# Patient Record
Sex: Male | Born: 1955 | Race: White | Hispanic: No | Marital: Married | Smoking: Former smoker
Health system: Southern US, Community
[De-identification: ages and names within clinical notes are randomized; demographics above are authoritative.]

## PROBLEM LIST (undated history)

## (undated) DIAGNOSIS — B019 Varicella without complication: Secondary | ICD-10-CM

## (undated) DIAGNOSIS — J45909 Unspecified asthma, uncomplicated: Secondary | ICD-10-CM

## (undated) DIAGNOSIS — J302 Other seasonal allergic rhinitis: Secondary | ICD-10-CM

## (undated) HISTORY — DX: Unspecified asthma, uncomplicated: J45.909

## (undated) HISTORY — DX: Other seasonal allergic rhinitis: J30.2

## (undated) HISTORY — DX: Varicella without complication: B01.9

---

## 2001-01-16 ENCOUNTER — Encounter: Payer: Self-pay | Admitting: Emergency Medicine

## 2001-01-16 ENCOUNTER — Emergency Department (HOSPITAL_COMMUNITY): Admission: EM | Admit: 2001-01-16 | Discharge: 2001-01-16 | Payer: Self-pay | Admitting: Emergency Medicine

## 2006-11-14 ENCOUNTER — Emergency Department (HOSPITAL_COMMUNITY): Admission: EM | Admit: 2006-11-14 | Discharge: 2006-11-14 | Payer: Self-pay | Admitting: Emergency Medicine

## 2008-07-25 IMAGING — CR DG FOOT COMPLETE 3+V*L*
3 series · 3 of 3 positions shown · non-contrast
Comparison: none

CLINICAL DATA: Fell four days ago on the ice.  Mid and lateral foot pain into toes.  
 LEFT FOOT ? 3 VIEW:

[t foot ap left]
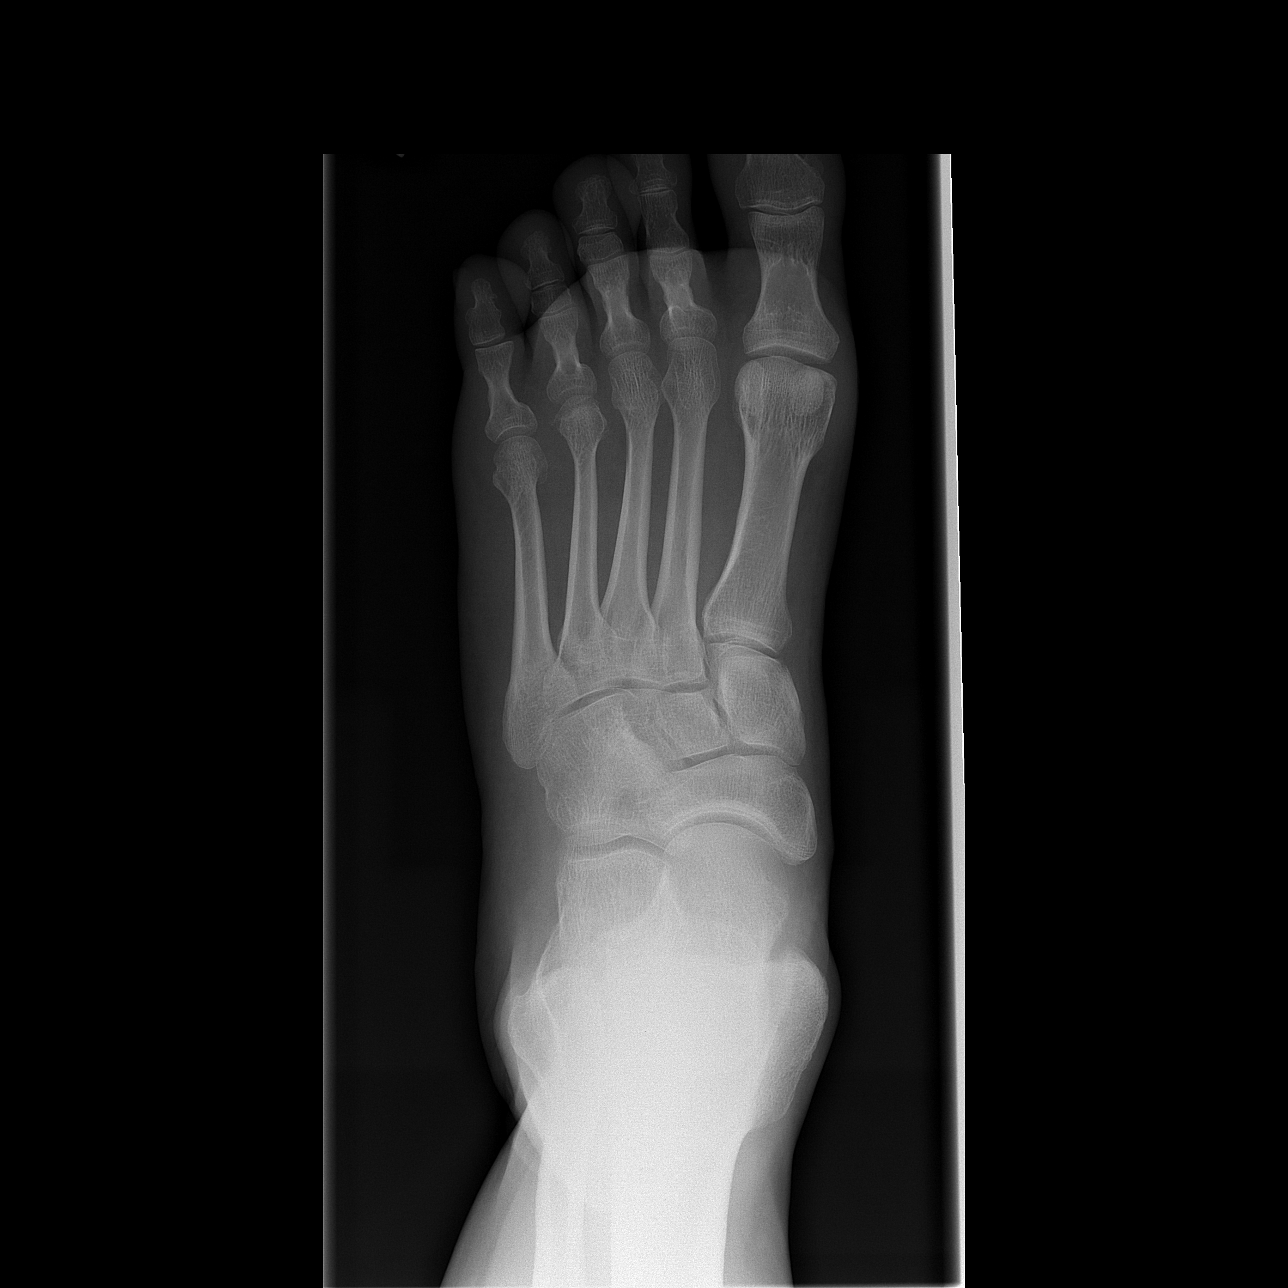

[t foot oblique left]
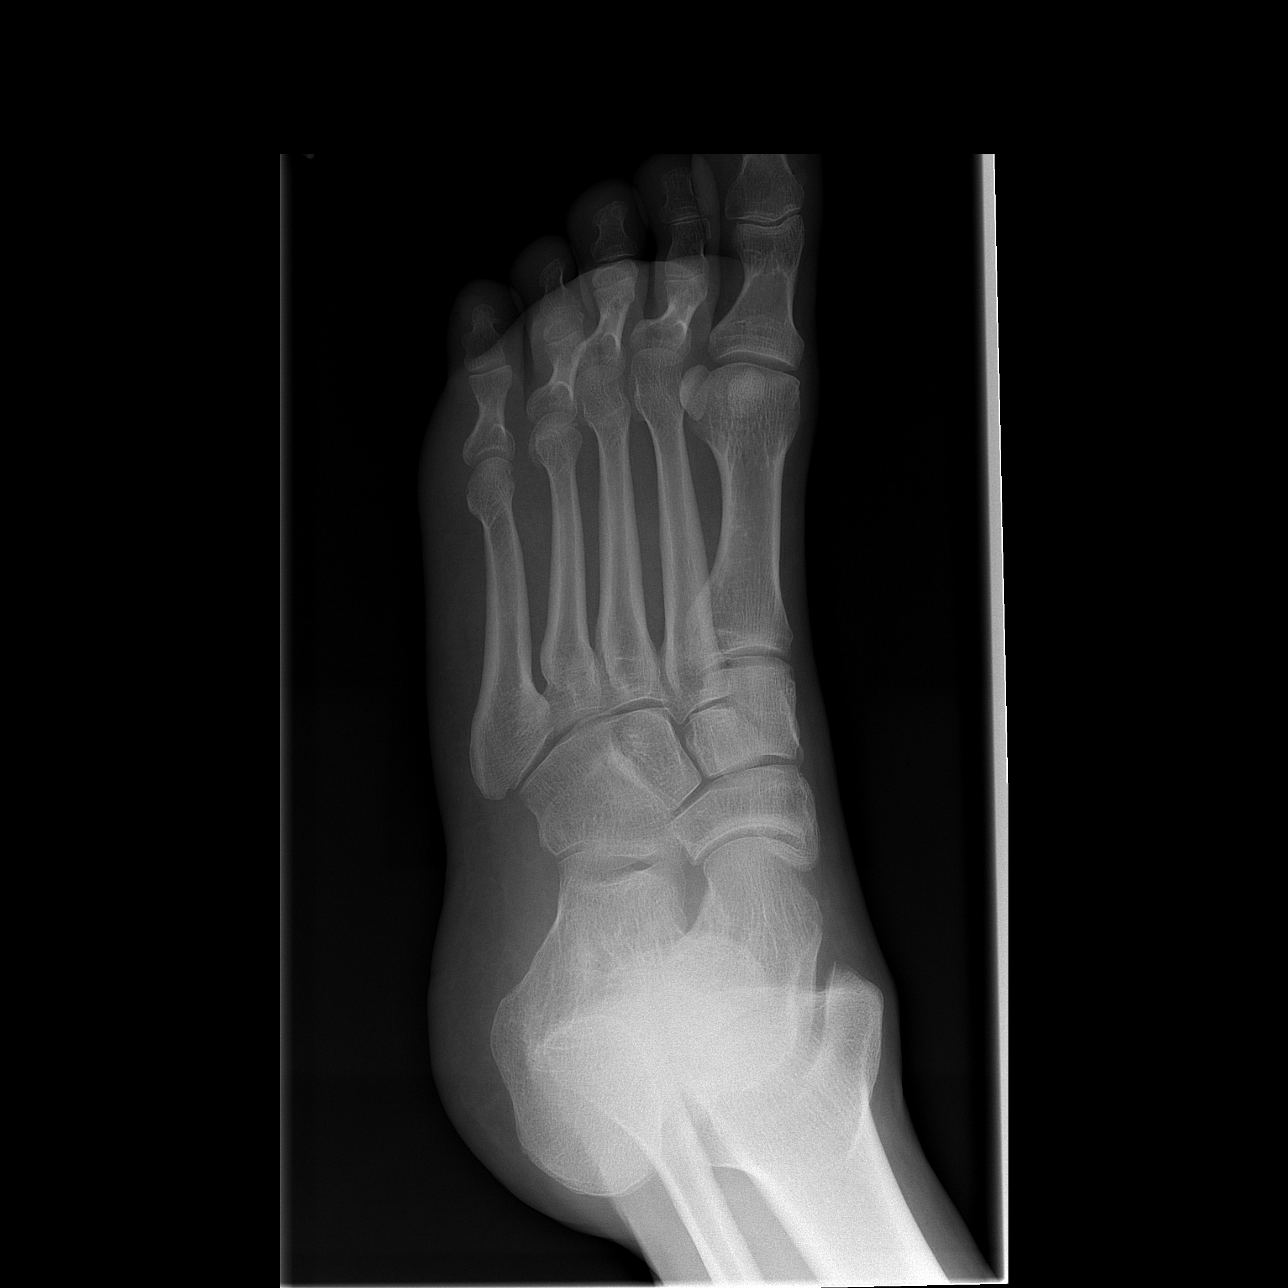

[t foot lat left]
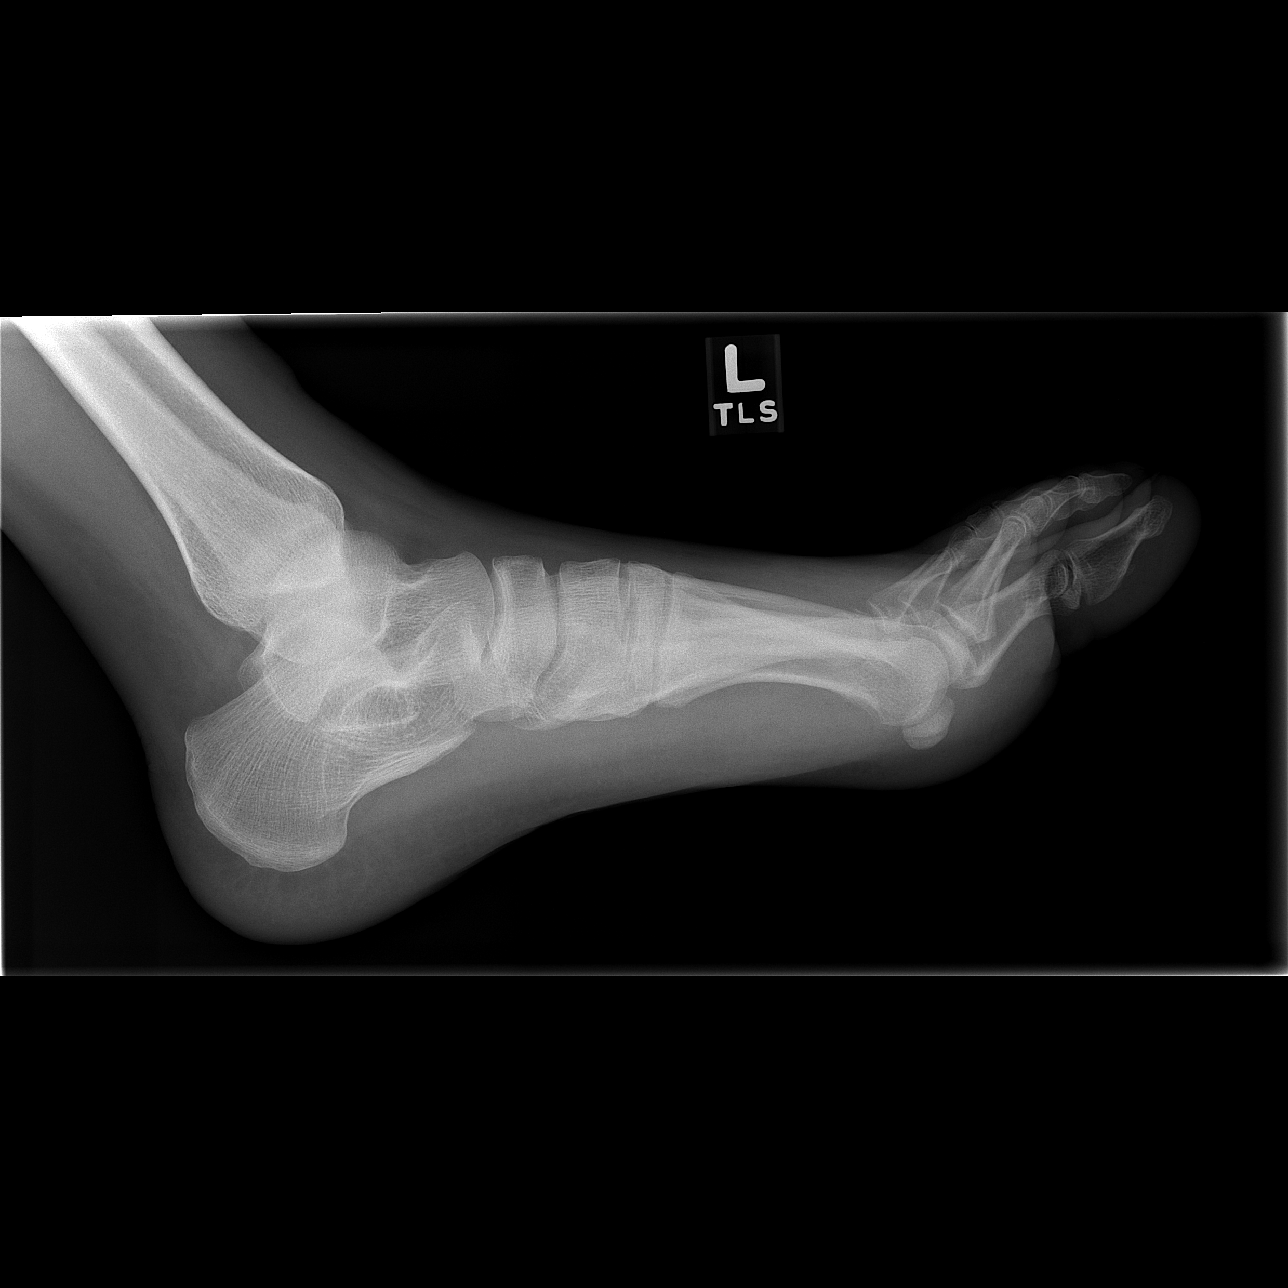

[3 of 3 positions shown; findings below may reference images not displayed]

FINDINGS: Forefoot soft tissue swelling.   No acute bony abnormality.
IMPRESSION: No fracture.

## 2017-10-25 HISTORY — PX: VARICOSE VEIN SURGERY: SHX832

## 2017-12-12 ENCOUNTER — Encounter: Payer: Self-pay | Admitting: Family Medicine

## 2017-12-12 ENCOUNTER — Ambulatory Visit: Payer: BLUE CROSS/BLUE SHIELD | Admitting: Family Medicine

## 2017-12-12 VITALS — BP 130/84 | HR 78 | Temp 98.3°F | Ht 71.0 in | Wt 204.6 lb

## 2017-12-12 DIAGNOSIS — J449 Chronic obstructive pulmonary disease, unspecified: Secondary | ICD-10-CM

## 2017-12-12 DIAGNOSIS — J309 Allergic rhinitis, unspecified: Secondary | ICD-10-CM | POA: Insufficient documentation

## 2017-12-12 DIAGNOSIS — R21 Rash and other nonspecific skin eruption: Secondary | ICD-10-CM | POA: Insufficient documentation

## 2017-12-12 DIAGNOSIS — L739 Follicular disorder, unspecified: Secondary | ICD-10-CM | POA: Diagnosis not present

## 2017-12-12 MED ORDER — KETOCONAZOLE 2 % EX CREA: 1.0000 "application " | TOPICAL_CREAM | Freq: Two times a day (BID) | CUTANEOUS | 1 refills | Status: DC

## 2017-12-12 NOTE — Assessment & Plan Note (Signed)
Possibly consistent with chronic fungal infection.  We will start ketoconazole cream twice daily for the next week.  Psoriasis is also a consideration, however less likely given location of lesion and lack of lesions elsewhere on the body. If no improvement on topical antifungal, would consider trial of mid to high potency topical steroid. May ultimately need biopsy if no improvement with topical therapies.

## 2017-12-12 NOTE — Patient Instructions (Signed)
Please start the ketoconazole.  Use twice daily for a week.   Let me know if not improving.  Come back to see me soon for your annual physical.  Take care, Dr Jimmey RalphParker

## 2017-12-12 NOTE — Progress Notes (Signed)
Subjective:  Justin Hudson is a 62 y.o. male who presents today with a chief complaint of rash and to establish care.   HPI:  Rash, new issue Patient with 2 separate rashes: 1.  Rash on left lower extremity.  This is been present for several years.  Has tried several over-the-counter medications including hydrogen peroxide and cortisone cream.  Neither of these significantly seem to help.  Rash is very itchy.  No obvious precipitating events.  Stable over the last several months.  No fevers.  Occasionally has yellow/bloody drainage.  No spreading erythema.  No pain.  2.  Rash on upper back.  This was been present for the past several weeks.  Relates this to recently being diagnosed with the flu.  No recent new exposures to soaps, detergents, lotions, or any other new contacts.  No fevers or chills.  ROS: Per HPI, otherwise complete review of systems is negative.  PMH:  The following were reviewed and entered/updated in epic: History reviewed. No pertinent past medical history. Patient Active Problem List   Diagnosis Date Noted  . OAD (obstructive airway disease) (HCC) 12/12/2017  . Allergic rhinitis 12/12/2017  . Skin rash 12/12/2017   History reviewed. No pertinent surgical history.  FH of cardiac disease. Two uncle died of MI.   Medications- reviewed and updated Current Outpatient Medications  Medication Sig Dispense Refill  . albuterol (PROAIR HFA) 108 (90 Base) MCG/ACT inhaler Inhale into the lungs.    [name] loratadine (CLARITIN) 10 MG tablet Take by mouth.    [name] ketoconazole (NIZORAL) 2 % cream Apply 1 application topically 2 (two) times daily. 60 g 1   No current facility-administered medications for this visit.    Allergies-reviewed and updated Allergies  Allergen Reactions  . Shellfish Allergy Anaphylaxis   Social History   Socioeconomic History  . Marital status: Married    Spouse name: None  . Number of children: None  . Years of education: None  . Highest  education level: None  Social Needs  . Financial resource strain: None  . Food insecurity - worry: None  . Food insecurity - inability: None  . Transportation needs - medical: None  . Transportation needs - non-medical: None  Occupational History  . None  Tobacco Use  . Smoking status: Former Smoker    Types: Cigarettes  . Smokeless tobacco: Never Used  Substance and Sexual Activity  . Alcohol use: Yes    Alcohol/week: 12.0 oz    Types: 20 Cans of beer per week  . Drug use: None  . Sexual activity: None  Other Topics Concern  . None  Social History Narrative  . None   Objective:  Physical Exam: BP 130/84   Pulse 78   Temp 98.3 F (36.8 C) (Oral)   Ht 5\' 11"  (1.803 m)   Wt 204 lb 9.6 oz (92.8 kg)   SpO2 96%   BMI 28.54 kg/m   Gen: NAD, resting comfortably CV: RRR with no murmurs appreciated Pulm: NWOB, CTAB with no crackles, wheezes, or rhonchi GI: Normal bowel sounds present. Soft, Nontender, Nondistended. MSK: No edema, cyanosis, or clubbing noted Skin: -Upper back: Several small, discrete, erythematous pustules in various stages of healing scattered across upper back. -Left lower extremity: Erythematous plaque with overlying scale and granulation tissue approximately 10 x 5 cm.  Please see below picture. Neuro: Grossly normal, moves all extremities Psych: Normal affect and thought content    Assessment/Plan:  Skin rash Possibly consistent with  chronic fungal infection.  We will start ketoconazole cream twice daily for the next week.  Psoriasis is also a consideration, however less likely given location of lesion and lack of lesions elsewhere on the body. If no improvement on topical antifungal, would consider trial of mid to high potency topical steroid. May ultimately need biopsy if no improvement with topical therapies.   Folliculitis Reassured patient.  Encouraged use of body wash with benzoyl peroxide.  Return precautions reviewed.  Preventative  healthcare Patient will return soon for CPE.  Katina Degreealeb M. Jimmey RalphParker, MD 12/12/2017 11:32 AM

## 2017-12-13 ENCOUNTER — Ambulatory Visit: Payer: Self-pay | Admitting: Physician Assistant

## 2019-09-11 ENCOUNTER — Other Ambulatory Visit: Payer: Self-pay

## 2019-09-11 DIAGNOSIS — Z20822 Contact with and (suspected) exposure to covid-19: Secondary | ICD-10-CM

## 2019-09-12 LAB — NOVEL CORONAVIRUS, NAA: SARS-CoV-2, NAA: DETECTED — AB

## 2020-01-03 ENCOUNTER — Ambulatory Visit: Payer: BLUE CROSS/BLUE SHIELD | Admitting: Cardiology

## 2020-01-31 NOTE — Progress Notes (Signed)
Cardiology Office Note   Date:  02/01/2020   ID:  Jeanie Sewer, DOB December 27, 1955, MRN [id number]  PCP:  Jani Gravel, MD  Cardiologist:   No primary care provider on file. Referring:  Jani Gravel, MD  Chief Complaint  Patient presents with  . Chest Pain      History of Present Illness: Justin Hudson is a 64 y.o. male who is referred by Jani Gravel, MD for evaluation of chest pain.  He has no prior cardiac history although he does have risk factors.  He did report a distant treadmill testing and echocardiography when he was 52.  He has been having some fleeting chest discomfort.  This is mild and across his mid and left upper chest.  He had comes on and lasts for short period of time.  It happens sporadically.  It is dull and feels like a muscle ache.  He cannot bring it on.  He can do some yard work and other activities without bringing this on.  It goes away spontaneously.  Is not worse with movement or position.  He does not think it is getting worse.  He has not had associated symptoms such as nausea vomiting or diaphoresis.  He never really had these symptoms before.  However, with his family history he was referred for further evaluation.   Past Medical History:  Diagnosis Date  . Asthma   . Chicken pox   . Seasonal allergies     Past Surgical History:  Procedure Laterality Date  . VARICOSE VEIN SURGERY  2019     Current Outpatient Medications  Medication Sig Dispense Refill  . albuterol (PROAIR HFA) 108 (90 Base) MCG/ACT inhaler Inhale into the lungs.    [name] loratadine (CLARITIN) 10 MG tablet Take by mouth.     No current facility-administered medications for this visit.    Allergies:   Shellfish allergy    Social History:  The patient  reports that he has quit smoking. His smoking use included cigarettes. He has never used smokeless tobacco. He reports current alcohol use of about 20.0 standard drinks of alcohol per week.   Family History:  The patient's family  history includes Alcohol abuse in his brother, maternal grandfather, and sister; Cancer in his father; Drug abuse in his brother and sister; Heart disease in his maternal grandmother and mother; Heart disease (age of onset: 13) in his father; Peripheral vascular disease (age of onset: 73) in his brother.    ROS:  Please see the history of present illness.   Otherwise, review of systems are positive for none.   All other systems are reviewed and negative.    PHYSICAL EXAM: VS:  BP (!) 150/101   Pulse 69   Temp 97.9 F (36.6 C)   Ht 6' (1.829 m)   Wt 206 lb 0.3 oz (93.5 kg)   SpO2 99%   BMI 27.94 kg/m  , BMI Body mass index is 27.94 kg/m. GENERAL:  Well appearing HEENT:  Pupils equal round and reactive, fundi not visualized, oral mucosa unremarkable NECK:  No jugular venous distention, waveform within normal limits, carotid upstroke brisk and symmetric, no bruits, no thyromegaly LYMPHATICS:  No cervical, inguinal adenopathy LUNGS:  Clear to auscultation bilaterally BACK:  No CVA tenderness CHEST:  Unremarkable HEART:  PMI not displaced or sustained,S1 and S2 within normal limits, no S3, no S4, no clicks, no rubs, no murmurs ABD:  Flat, positive bowel sounds normal in frequency in pitch, no  bruits, no rebound, no guarding, no midline pulsatile mass, no hepatomegaly, no splenomegaly EXT:  2 plus pulses throughout, no edema, no cyanosis no clubbing SKIN:  No rashes no nodules NEURO:  Cranial nerves II through XII grossly intact, motor grossly intact throughout PSYCH:  Cognitively intact, oriented to person place and time    EKG:  EKG is ordered today. The ekg ordered today demonstrates sinus rhythm, rate 69, axis within normal limits, intervals within normal limits, no acute ST-T wave changes.   Recent Labs: No results found for requested labs within last 8760 hours.    Lipid Panel No results found for: CHOL, TRIG, HDL, CHOLHDL, VLDL, LDLCALC, LDLDIRECT    Wt Readings from  Last 3 Encounters:  02/01/20 206 lb 0.3 oz (93.5 kg)  12/12/17 204 lb 9.6 oz (92.8 kg)      Other studies Reviewed: Additional studies/ records that were reviewed today include: Labs. Review of the above records demonstrates:  Please see elsewhere in the note.     ASSESSMENT AND PLAN:  CHEST PAIN: This is atypical but he has had significant risk factors.  Given this I think it is very reasonable to screen him with a coronary calcium score.  This will determine the need for further testing and also goals of therapy for his lipids which are pending.  HTN: We had a long discussion about this.  He is never been told he was hypertensive and his last blood pressure was fine and a blood pressure in our system was fine.  He is going to keep a record of his blood pressures have suggested a blood pressure diary.  However, I do not think he needs med titration given the fact that this is an isolated elevated reading.  COVID EDUCATION: He has no interest in the Covid vaccine.  He says he has had Covid.   Current medicines are reviewed at length with the patient today.  The patient does not have concerns regarding medicines.  The following changes have been made:  no change  Labs/ tests ordered today include:   Orders Placed This Encounter  Procedures  . CT CARDIAC SCORING  . EKG 12-Lead     Disposition:   FU with me as needed based on the results of the above.     Signed, Rollene Rotunda, MD  02/01/2020 12:34 PM    [facility]

## 2020-02-01 ENCOUNTER — Encounter: Payer: Self-pay | Admitting: Cardiology

## 2020-02-01 ENCOUNTER — Other Ambulatory Visit: Payer: Self-pay

## 2020-02-01 ENCOUNTER — Ambulatory Visit (INDEPENDENT_AMBULATORY_CARE_PROVIDER_SITE_OTHER): Payer: Self-pay | Admitting: Cardiology

## 2020-02-01 DIAGNOSIS — R072 Precordial pain: Secondary | ICD-10-CM

## 2020-02-01 NOTE — Patient Instructions (Signed)
Medication Instructions:  No changes *If you need a refill on your cardiac medications before your next appointment, please call your pharmacy*  Lab Work: None ordered this visit  Testing/Procedures: Dr. Percival Spanish has ordered a CT coronary calcium score. This test is done at [address]. This is $150 out of pocket. Coronary CalciumScan A coronary calcium scan is an imaging test used to look for deposits of calcium and other fatty materials (plaques) in the inner lining of the blood vessels of the heart (coronary arteries). These deposits of calcium and plaques can partly clog and narrow the coronary arteries without producing any symptoms or warning signs. This puts a person at risk for a heart attack. This test can detect these deposits before symptoms develop. Tell a health care provider about:  Any allergies you have.  All medicines you are taking, including vitamins, herbs, eye drops, creams, and over-the-counter medicines.  Any problems you or family members have had with anesthetic medicines.  Any blood disorders you have.  Any surgeries you have had.  Any medical conditions you have.  Whether you are pregnant or may be pregnant. What are the risks? Generally, this is a safe procedure. However, problems may occur, including:  Harm to a pregnant woman and her unborn baby. This test involves the use of radiation. Radiation exposure can be dangerous to a pregnant woman and her unborn baby. If you are pregnant, you generally should not have this procedure done.  Slight increase in the risk of cancer. This is because of the radiation involved in the test. What happens before the procedure? No preparation is needed for this procedure. What happens during the procedure?  You will undress and remove any jewelry around your neck or chest.  You will put on a hospital gown.  Sticky electrodes will be placed on your chest. The electrodes will be connected to an  electrocardiogram (ECG) machine to record a tracing of the electrical activity of your heart.  A CT scanner will take pictures of your heart. During this time, you will be asked to lie still and hold your breath for 2-3 seconds while a picture of your heart is being taken. The procedure may vary among health care providers and hospitals. What happens after the procedure?  You can get dressed.  You can return to your normal activities.  It is up to you to get the results of your test. Ask your health care provider, or the department that is doing the test, when your results will be ready. Summary  A coronary calcium scan is an imaging test used to look for deposits of calcium and other fatty materials (plaques) in the inner lining of the blood vessels of the heart (coronary arteries).  Generally, this is a safe procedure. Tell your health care provider if you are pregnant or may be pregnant.  No preparation is needed for this procedure.  A CT scanner will take pictures of your heart.  You can return to your normal activities after the scan is done. This information is not intended to replace advice given to you by your health care provider. Make sure you discuss any questions you have with your health care provider. Document Released: 04/08/2008 Document Revised: 08/30/2016 Document Reviewed: 08/30/2016 Elsevier Interactive Patient Education  2017 [place]: At [facility], you and your health needs are our priority.  As part of our continuing mission to provide you with exceptional heart care, we have created designated  Provider Care Teams.  These Care Teams include your primary Cardiologist (physician) and Advanced Practice Providers (APPs -  Physician Assistants and Nurse Practitioners) who all work together to provide you with the care you need, when you need it.  Your next appointment:   Follow up as needed

## 2020-02-08 ENCOUNTER — Telehealth: Payer: Self-pay | Admitting: Cardiology

## 2020-02-08 NOTE — Telephone Encounter (Signed)
New message:  Patient would like to cancel his up coming appt at [facility] for a CT. Please call patient.

## 2020-02-08 NOTE — Telephone Encounter (Signed)
Contacted patient- he states it was already taken care of, he just needed to move the dates.

## 2020-02-13 ENCOUNTER — Other Ambulatory Visit: Payer: Self-pay

## 2020-02-15 ENCOUNTER — Ambulatory Visit (INDEPENDENT_AMBULATORY_CARE_PROVIDER_SITE_OTHER)
Admission: RE | Admit: 2020-02-15 | Discharge: 2020-02-15 | Disposition: A | Discharge status: Home / Self Care | Payer: Self-pay | Source: Ambulatory Visit | Attending: Cardiology | Admitting: Cardiology

## 2020-02-15 ENCOUNTER — Other Ambulatory Visit: Payer: Self-pay

## 2020-02-15 DIAGNOSIS — R072 Precordial pain: Secondary | ICD-10-CM

## 2020-02-18 ENCOUNTER — Other Ambulatory Visit: Payer: Self-pay

## 2020-02-18 ENCOUNTER — Telehealth: Payer: Self-pay | Admitting: Cardiology

## 2020-02-18 DIAGNOSIS — R931 Abnormal findings on diagnostic imaging of heart and coronary circulation: Secondary | ICD-10-CM

## 2020-02-18 NOTE — Telephone Encounter (Signed)
Attempted to call patient, voicemail left 

## 2020-02-18 NOTE — Progress Notes (Signed)
Per Dr. Antoine Poche order placed for POET due to elevated coronary calcium.

## 2020-02-18 NOTE — Telephone Encounter (Signed)
   Pt is returning call from [facility] regarding CT result  Please call

## 2020-02-19 NOTE — Telephone Encounter (Signed)
Left message for patient to call regarding ETT and COVID prescreening ordered by Dr. Antoine Poche

## 2020-02-25 NOTE — Telephone Encounter (Signed)
Spoke with patient regarding appointment for ETT and COVID screening ordered by Dr. Michael Boston states he cannot do anything until after next Thursday--

## 2020-03-10 ENCOUNTER — Encounter (HOSPITAL_COMMUNITY): Payer: Self-pay | Admitting: Cardiology

## 2020-03-26 ENCOUNTER — Telehealth (HOSPITAL_COMMUNITY): Payer: Self-pay | Admitting: Cardiology

## 2020-03-26 NOTE — Telephone Encounter (Signed)
Just an FYI. We have made several attempts to contact this patient including sending a letter to schedule or reschedule their GXT. We will be removing the patient from the echo WQ.   Mailed letter  03/10/20 [facility] to schedule @ 1:20/LBW  03/06/20 [facility] to schedule @ 9:54/LBW  02/19/20 [facility] to schedule @ 9:59/LBW  wt 206     Thank you

## 2020-03-26 NOTE — Telephone Encounter (Signed)
Will forward to Dr Hochrein so he will be aware  

## 2021-10-26 IMAGING — CT CT CARDIAC CORONARY ARTERY CALCIUM SCORE
3 series · 14 of 20 positions shown, 15 images · non-contrast
Comparison: None.
COMPARISON: None.

Addendum:
EXAM:
OVER-READ INTERPRETATION  CT CHEST

The following report is an over-read performed by radiologist Dr.
Isadora Diouf [[email or web address]] on 02/15/2020. This
over-read does not include interpretation of cardiac or coronary
anatomy or pathology. The coronary calcium score interpretation by
the cardiologist is attached.
CLINICAL DATA: Risk stratification
Coronary Calcium Score
TECHNIQUE: The patient was scanned on a Siemens Force scanner. Axial
non-contrast 3 mm slices were carried out through the heart. The
data set was analyzed on a dedicated work station and scored using
the Agatson method.

[Series 2: casc 3.0 bv41 2 bestsyst 34 % · axial · 0.42mm/px · z∈[-194,-122]mm · 4 of 42 slices shown, 5 images]
[im 9/42  vessel]
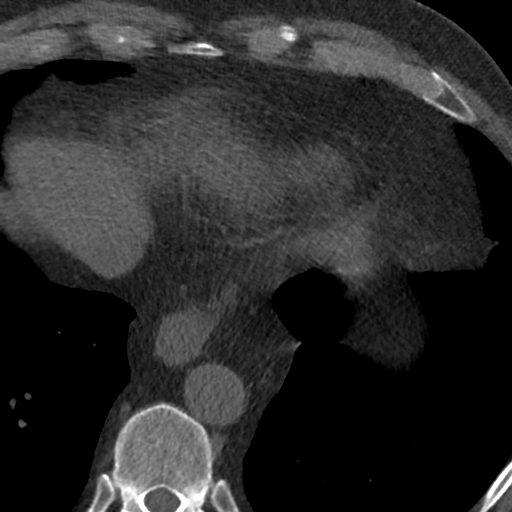
[im 9/42  lung]
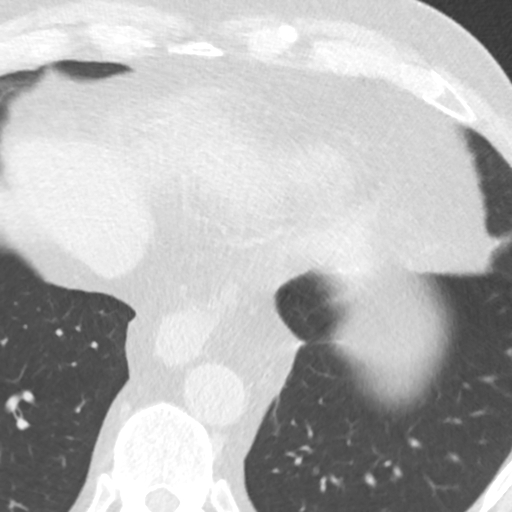
[im 17/42  vessel]
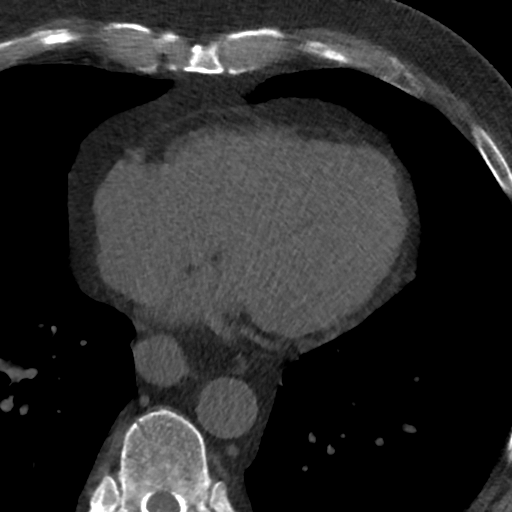
[im 25/42  vessel]
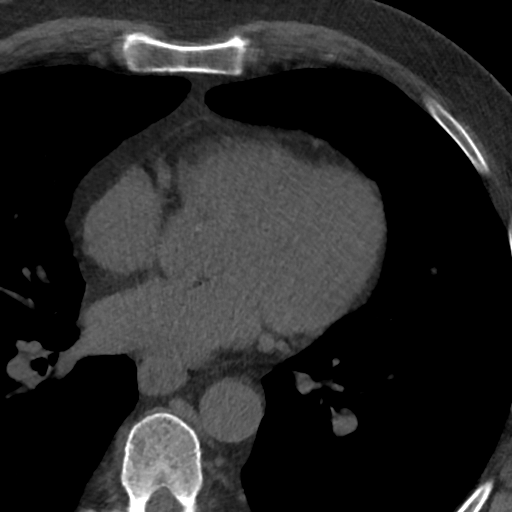
[im 33/42  vessel]
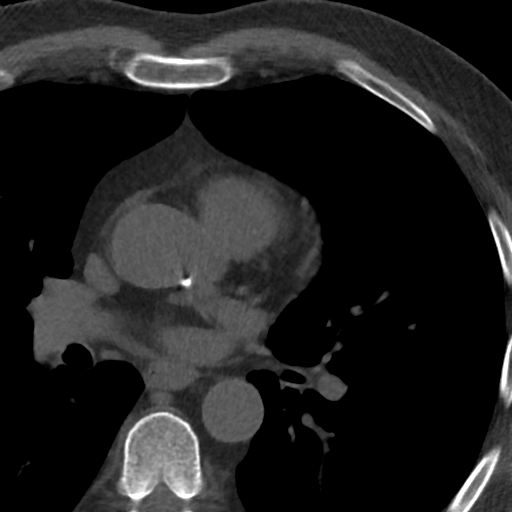

[Series 3: lung 35 % · axial · 0.73mm/px · z∈[-200,-116]mm · 5 of 42 slices shown]
[im 7/42  lung]
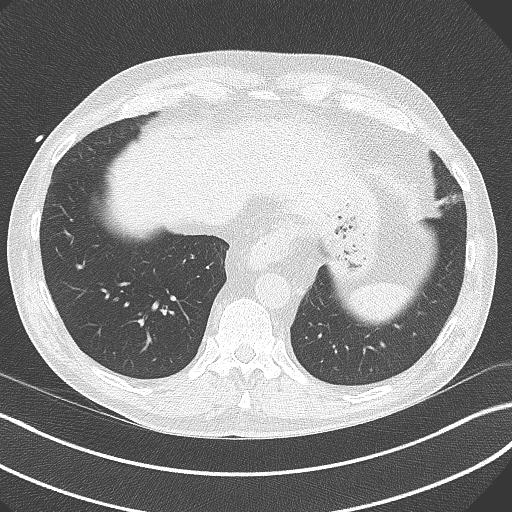
[im 14/42  lung]
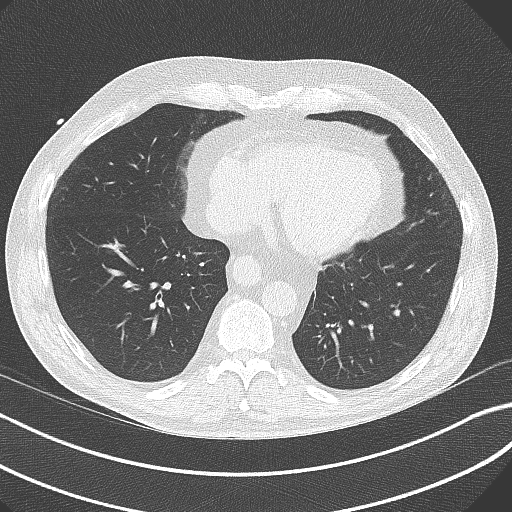
[im 21/42  lung]
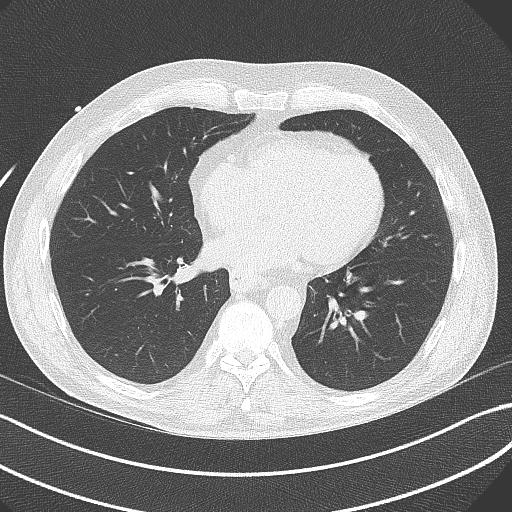
[im 28/42  lung]
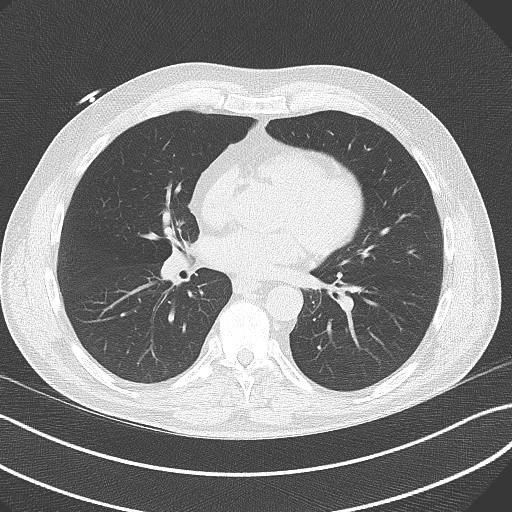
[im 35/42  lung]
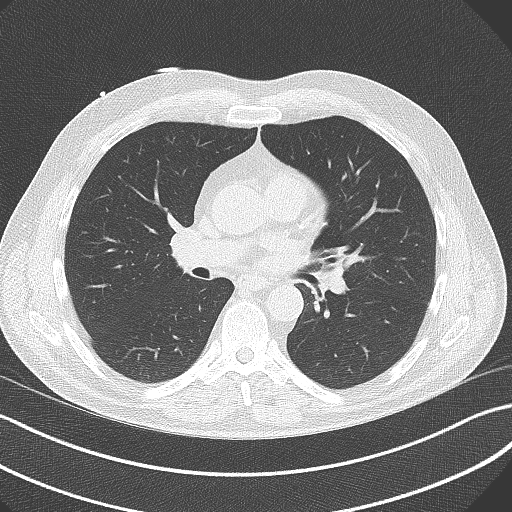

[Series 4: lung st 35 % · axial · 0.73mm/px · z∈[-200,-116]mm · 5 of 42 slices shown]
[im 7/42  lung]
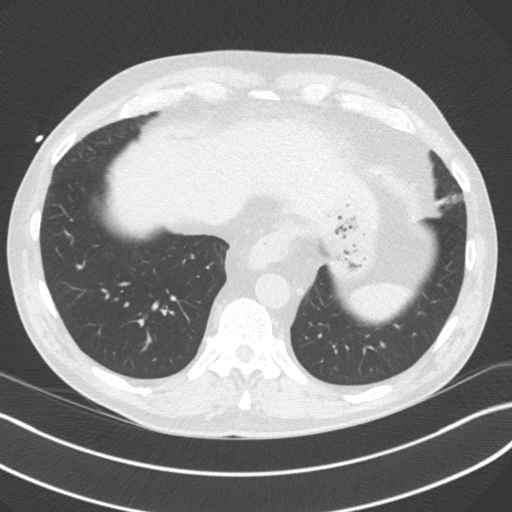
[im 14/42  lung]
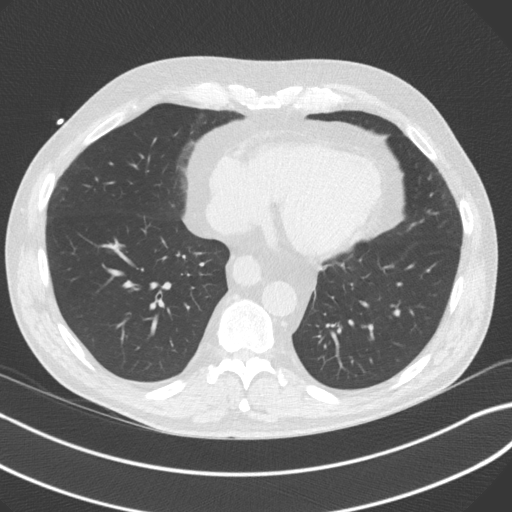
[im 21/42  lung]
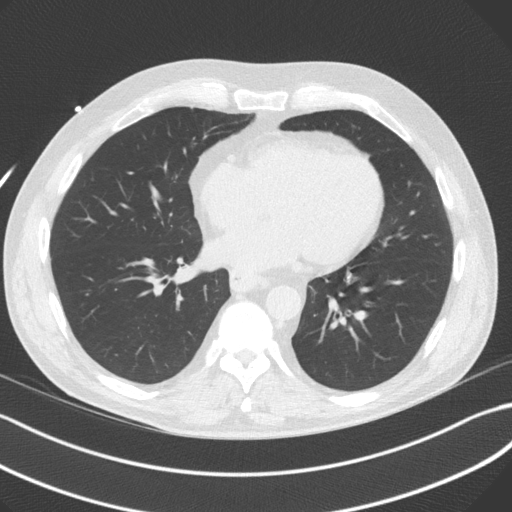
[im 28/42  lung]
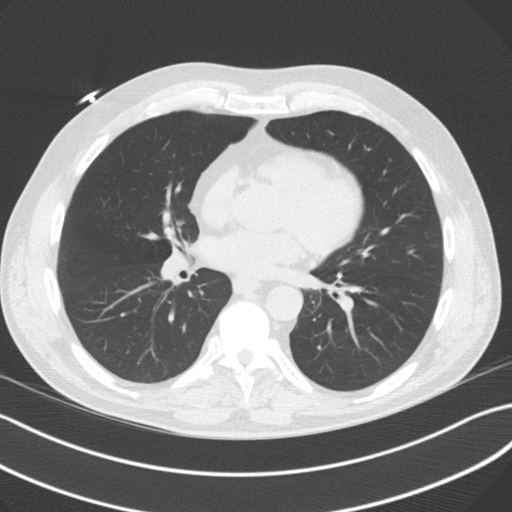
[im 35/42  lung]
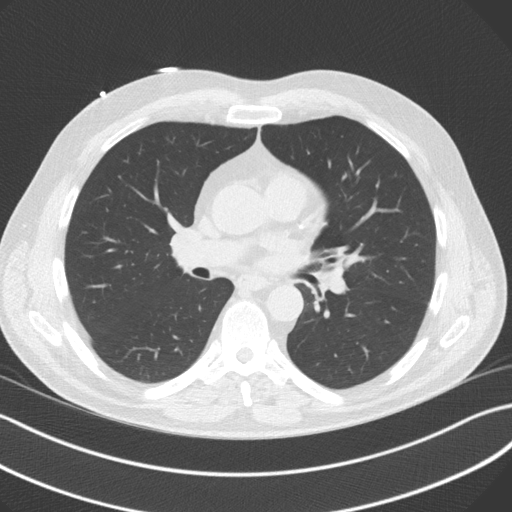

[14 of 20 positions shown; findings below may reference images not displayed]

FINDINGS: Aortic atherosclerosis. Within the visualized portions of the thorax
there are no suspicious appearing pulmonary nodules or masses, there
is no acute consolidative airspace disease, no pleural effusions, no
pneumothorax and no lymphadenopathy. Visualized portions of the
upper abdomen are unremarkable. There are no aggressive appearing
lytic or blastic lesions noted in the visualized portions of the
skeleton.
IMPRESSION: 1.  Aortic Atherosclerosis (S1MG5-UVK.K).
FINDINGS: Non-cardiac: See separate report from [REDACTED].

Ascending Aorta: Normal caliber.  Aortic annular calcification.

Pericardium: Normal

Coronary arteries: Normal origin.
IMPRESSION: Coronary calcium score of 13.5. This was 37th percentile for age and
sex matched control.

*** End of Addendum ***
EXAM:
OVER-READ INTERPRETATION  CT CHEST

The following report is an over-read performed by radiologist Dr.
Isadora Diouf [[email or web address]] on 02/15/2020. This
over-read does not include interpretation of cardiac or coronary
anatomy or pathology. The coronary calcium score interpretation by
the cardiologist is attached.
FINDINGS: Aortic atherosclerosis. Within the visualized portions of the thorax
there are no suspicious appearing pulmonary nodules or masses, there
is no acute consolidative airspace disease, no pleural effusions, no
pneumothorax and no lymphadenopathy. Visualized portions of the
upper abdomen are unremarkable. There are no aggressive appearing
lytic or blastic lesions noted in the visualized portions of the
skeleton.
IMPRESSION: 1.  Aortic Atherosclerosis (S1MG5-UVK.K).

## 2023-09-26 DIAGNOSIS — H524 Presbyopia: Secondary | ICD-10-CM | POA: Diagnosis not present

## 2023-09-26 DIAGNOSIS — H5203 Hypermetropia, bilateral: Secondary | ICD-10-CM | POA: Diagnosis not present

## 2023-09-27 DIAGNOSIS — Z8249 Family history of ischemic heart disease and other diseases of the circulatory system: Secondary | ICD-10-CM | POA: Diagnosis not present

## 2023-09-27 DIAGNOSIS — Z800 Family history of malignant neoplasm of digestive organs: Secondary | ICD-10-CM | POA: Diagnosis not present

## 2023-09-27 DIAGNOSIS — R5381 Other malaise: Secondary | ICD-10-CM | POA: Diagnosis not present

## 2023-09-27 DIAGNOSIS — R5383 Other fatigue: Secondary | ICD-10-CM | POA: Diagnosis not present

## 2023-09-27 DIAGNOSIS — J45909 Unspecified asthma, uncomplicated: Secondary | ICD-10-CM | POA: Diagnosis not present

## 2023-09-27 DIAGNOSIS — Z0000 Encounter for general adult medical examination without abnormal findings: Secondary | ICD-10-CM | POA: Diagnosis not present

## 2023-09-28 ENCOUNTER — Other Ambulatory Visit (HOSPITAL_COMMUNITY): Payer: Self-pay | Admitting: Registered Nurse

## 2023-09-28 DIAGNOSIS — Z8249 Family history of ischemic heart disease and other diseases of the circulatory system: Secondary | ICD-10-CM

## 2023-10-24 ENCOUNTER — Other Ambulatory Visit (HOSPITAL_COMMUNITY): Payer: Self-pay

## 2023-10-25 ENCOUNTER — Other Ambulatory Visit (HOSPITAL_COMMUNITY): Payer: Self-pay

## 2023-10-25 ENCOUNTER — Ambulatory Visit (HOSPITAL_COMMUNITY)
Admission: RE | Admit: 2023-10-25 | Discharge: 2023-10-25 | Disposition: A | Discharge status: Home / Self Care | Payer: Self-pay | Source: Ambulatory Visit | Attending: Registered Nurse | Admitting: Registered Nurse

## 2023-10-25 DIAGNOSIS — Z8249 Family history of ischemic heart disease and other diseases of the circulatory system: Secondary | ICD-10-CM | POA: Insufficient documentation

## 2023-11-23 DIAGNOSIS — R3914 Feeling of incomplete bladder emptying: Secondary | ICD-10-CM | POA: Diagnosis not present

## 2023-11-23 DIAGNOSIS — R3912 Poor urinary stream: Secondary | ICD-10-CM | POA: Diagnosis not present

## 2023-12-02 ENCOUNTER — Encounter (HOSPITAL_BASED_OUTPATIENT_CLINIC_OR_DEPARTMENT_OTHER): Payer: Self-pay

## 2024-04-07 DIAGNOSIS — T2103XA Burn of unspecified degree of upper back, initial encounter: Secondary | ICD-10-CM | POA: Diagnosis not present

## 2024-04-07 DIAGNOSIS — T310 Burns involving less than 10% of body surface: Secondary | ICD-10-CM | POA: Diagnosis not present

## 2024-04-07 DIAGNOSIS — T3110 Burns involving 10-19% of body surface with 0% to 9% third degree burns: Secondary | ICD-10-CM | POA: Diagnosis not present

## 2024-04-07 DIAGNOSIS — T22052A Burn of unspecified degree of left shoulder, initial encounter: Secondary | ICD-10-CM | POA: Diagnosis not present

## 2024-04-07 DIAGNOSIS — T2200XA Burn of unspecified degree of shoulder and upper limb, except wrist and hand, unspecified site, initial encounter: Secondary | ICD-10-CM | POA: Diagnosis not present

## 2024-04-07 DIAGNOSIS — T2020XA Burn of second degree of head, face, and neck, unspecified site, initial encounter: Secondary | ICD-10-CM | POA: Diagnosis not present

## 2024-04-07 DIAGNOSIS — T20012A Burn of unspecified degree of left ear [any part, except ear drum], initial encounter: Secondary | ICD-10-CM | POA: Diagnosis not present

## 2024-04-07 DIAGNOSIS — T2007XA Burn of unspecified degree of neck, initial encounter: Secondary | ICD-10-CM | POA: Diagnosis not present

## 2024-04-07 DIAGNOSIS — T300 Burn of unspecified body region, unspecified degree: Secondary | ICD-10-CM | POA: Diagnosis not present

## 2024-04-07 DIAGNOSIS — T2000XA Burn of unspecified degree of head, face, and neck, unspecified site, initial encounter: Secondary | ICD-10-CM | POA: Diagnosis not present

## 2024-04-07 DIAGNOSIS — R070 Pain in throat: Secondary | ICD-10-CM | POA: Diagnosis not present

## 2024-04-07 DIAGNOSIS — M79645 Pain in left finger(s): Secondary | ICD-10-CM | POA: Diagnosis not present

## 2024-04-07 DIAGNOSIS — T50904A Poisoning by unspecified drugs, medicaments and biological substances, undetermined, initial encounter: Secondary | ICD-10-CM | POA: Diagnosis not present

## 2024-04-11 ENCOUNTER — Telehealth: Payer: Self-pay

## 2024-04-11 DIAGNOSIS — T2037XA Burn of third degree of neck, initial encounter: Secondary | ICD-10-CM | POA: Diagnosis not present

## 2024-04-11 DIAGNOSIS — E440 Moderate protein-calorie malnutrition: Secondary | ICD-10-CM | POA: Diagnosis not present

## 2024-04-11 DIAGNOSIS — T3111 Burns involving 10-19% of body surface with 10-19% third degree burns: Secondary | ICD-10-CM | POA: Diagnosis not present

## 2024-04-11 DIAGNOSIS — R739 Hyperglycemia, unspecified: Secondary | ICD-10-CM | POA: Diagnosis not present

## 2024-04-11 DIAGNOSIS — I10 Essential (primary) hypertension: Secondary | ICD-10-CM | POA: Diagnosis not present

## 2024-04-11 DIAGNOSIS — R404 Transient alteration of awareness: Secondary | ICD-10-CM | POA: Diagnosis not present

## 2024-04-11 DIAGNOSIS — T2134XA Burn of third degree of lower back, initial encounter: Secondary | ICD-10-CM | POA: Diagnosis not present

## 2024-04-11 DIAGNOSIS — T3110 Burns involving 10-19% of body surface with 0% to 9% third degree burns: Secondary | ICD-10-CM | POA: Diagnosis not present

## 2024-04-11 DIAGNOSIS — G9341 Metabolic encephalopathy: Secondary | ICD-10-CM | POA: Diagnosis not present

## 2024-04-11 DIAGNOSIS — J4489 Other specified chronic obstructive pulmonary disease: Secondary | ICD-10-CM | POA: Diagnosis not present

## 2024-04-11 DIAGNOSIS — R509 Fever, unspecified: Secondary | ICD-10-CM | POA: Diagnosis not present

## 2024-04-11 DIAGNOSIS — R000 Tachycardia, unspecified: Secondary | ICD-10-CM | POA: Diagnosis not present

## 2024-04-11 DIAGNOSIS — E8809 Other disorders of plasma-protein metabolism, not elsewhere classified: Secondary | ICD-10-CM | POA: Diagnosis not present

## 2024-04-11 DIAGNOSIS — N179 Acute kidney failure, unspecified: Secondary | ICD-10-CM | POA: Diagnosis not present

## 2024-04-11 DIAGNOSIS — R410 Disorientation, unspecified: Secondary | ICD-10-CM | POA: Diagnosis not present

## 2024-04-11 DIAGNOSIS — R918 Other nonspecific abnormal finding of lung field: Secondary | ICD-10-CM | POA: Diagnosis not present

## 2024-04-11 DIAGNOSIS — T2131XA Burn of third degree of chest wall, initial encounter: Secondary | ICD-10-CM | POA: Diagnosis not present

## 2024-04-11 DIAGNOSIS — R6520 Severe sepsis without septic shock: Secondary | ICD-10-CM | POA: Diagnosis not present

## 2024-04-11 DIAGNOSIS — L03311 Cellulitis of abdominal wall: Secondary | ICD-10-CM | POA: Diagnosis not present

## 2024-04-11 DIAGNOSIS — T300 Burn of unspecified body region, unspecified degree: Secondary | ICD-10-CM | POA: Diagnosis not present

## 2024-04-11 DIAGNOSIS — T2230XA Burn of third degree of shoulder and upper limb, except wrist and hand, unspecified site, initial encounter: Secondary | ICD-10-CM | POA: Diagnosis not present

## 2024-04-11 DIAGNOSIS — I499 Cardiac arrhythmia, unspecified: Secondary | ICD-10-CM | POA: Diagnosis not present

## 2024-04-11 DIAGNOSIS — A419 Sepsis, unspecified organism: Secondary | ICD-10-CM | POA: Diagnosis not present

## 2024-04-11 DIAGNOSIS — T2124XA Burn of second degree of lower back, initial encounter: Secondary | ICD-10-CM | POA: Diagnosis not present

## 2024-04-11 DIAGNOSIS — T23302A Burn of third degree of left hand, unspecified site, initial encounter: Secondary | ICD-10-CM | POA: Diagnosis not present

## 2024-04-11 DIAGNOSIS — E778 Other disorders of glycoprotein metabolism: Secondary | ICD-10-CM | POA: Diagnosis not present

## 2024-04-11 DIAGNOSIS — R197 Diarrhea, unspecified: Secondary | ICD-10-CM | POA: Diagnosis not present

## 2024-04-11 DIAGNOSIS — G9340 Encephalopathy, unspecified: Secondary | ICD-10-CM | POA: Diagnosis not present

## 2024-04-11 DIAGNOSIS — R519 Headache, unspecified: Secondary | ICD-10-CM | POA: Diagnosis not present

## 2024-04-11 NOTE — Transitions of Care (Post Inpatient/ED Visit) (Signed)
   04/11/2024  Name: DAYTEN JUBA MRN: [id number] DOB: 1955/12/05  Today's TOC FU Call Status: Today's TOC FU Call Status:: Unsuccessful Call (1st Attempt) Unsuccessful Call (1st Attempt) Date: 04/11/24  Attempted to reach the patient regarding the most recent Inpatient/ED visit.  Follow Up Plan: Additional outreach attempts will be made to reach the patient to complete the Transitions of Care (Post Inpatient/ED visit) call.   Earl Losee J. Pietro Bonura RN, MSN [facility], [facility] RN Care Manager Direct Dial: [phone]  Fax: [phone] Website: [email or web address]

## 2024-04-12 ENCOUNTER — Telehealth: Payer: Self-pay

## 2024-04-12 DIAGNOSIS — R509 Fever, unspecified: Secondary | ICD-10-CM | POA: Diagnosis not present

## 2024-04-12 NOTE — Transitions of Care (Post Inpatient/ED Visit) (Signed)
   04/12/2024  Name: Justin Hudson MRN: [id number] DOB: 1956-04-03  Today's TOC FU Call Status: Today's TOC FU Call Status:: Unsuccessful Call (2nd Attempt) Unsuccessful Call (2nd Attempt) Date: 04/12/24  Attempted to reach the patient regarding the most recent Inpatient/ED visit.  Follow Up Plan: Additional outreach attempts will be made to reach the patient to complete the Transitions of Care (Post Inpatient/ED visit) call.   Kehinde Totzke J. Cynthia Cogle RN, MSN [facility], [facility] RN Care Manager Direct Dial: [phone]  Fax: [phone] Website: [email or web address]

## 2024-04-13 ENCOUNTER — Telehealth: Payer: Self-pay

## 2024-04-13 DIAGNOSIS — R2232 Localized swelling, mass and lump, left upper limb: Secondary | ICD-10-CM | POA: Diagnosis not present

## 2024-04-13 DIAGNOSIS — R000 Tachycardia, unspecified: Secondary | ICD-10-CM | POA: Diagnosis not present

## 2024-04-13 DIAGNOSIS — R509 Fever, unspecified: Secondary | ICD-10-CM | POA: Diagnosis not present

## 2024-04-13 NOTE — Transitions of Care (Post Inpatient/ED Visit) (Signed)
   04/13/2024  Name: Justin Hudson MRN: [id number] DOB: 1956/08/30  Today's TOC FU Call Status: Today's TOC FU Call Status:: Unsuccessful Call (3rd Attempt) Unsuccessful Call (3rd Attempt) Date: 04/13/24  Attempted to reach the patient regarding the most recent Inpatient/ED visit.  Follow Up Plan: No further outreach attempts will be made at this time. We have been unable to contact the patient.  Sierra Spargo J. Sandee Bernath RN, MSN [facility], [facility] RN Care Manager Direct Dial: [phone]  Fax: [phone] Website: [email or web address]

## 2024-04-14 DIAGNOSIS — R000 Tachycardia, unspecified: Secondary | ICD-10-CM | POA: Diagnosis not present

## 2024-04-14 DIAGNOSIS — R509 Fever, unspecified: Secondary | ICD-10-CM | POA: Diagnosis not present

## 2024-04-15 DIAGNOSIS — R509 Fever, unspecified: Secondary | ICD-10-CM | POA: Diagnosis not present

## 2024-04-15 DIAGNOSIS — R000 Tachycardia, unspecified: Secondary | ICD-10-CM | POA: Diagnosis not present

## 2024-04-16 DIAGNOSIS — R000 Tachycardia, unspecified: Secondary | ICD-10-CM | POA: Diagnosis not present

## 2024-04-16 DIAGNOSIS — R509 Fever, unspecified: Secondary | ICD-10-CM | POA: Diagnosis not present

## 2024-04-17 DIAGNOSIS — R509 Fever, unspecified: Secondary | ICD-10-CM | POA: Diagnosis not present

## 2024-04-19 DIAGNOSIS — T3110 Burns involving 10-19% of body surface with 0% to 9% third degree burns: Secondary | ICD-10-CM | POA: Diagnosis not present

## 2024-04-20 DIAGNOSIS — T3110 Burns involving 10-19% of body surface with 0% to 9% third degree burns: Secondary | ICD-10-CM | POA: Diagnosis not present

## 2024-04-20 DIAGNOSIS — T2230XA Burn of third degree of shoulder and upper limb, except wrist and hand, unspecified site, initial encounter: Secondary | ICD-10-CM | POA: Diagnosis not present

## 2024-04-20 DIAGNOSIS — T2124XA Burn of second degree of lower back, initial encounter: Secondary | ICD-10-CM | POA: Diagnosis not present

## 2024-04-20 DIAGNOSIS — T23302A Burn of third degree of left hand, unspecified site, initial encounter: Secondary | ICD-10-CM | POA: Diagnosis not present

## 2024-04-20 DIAGNOSIS — T3111 Burns involving 10-19% of body surface with 10-19% third degree burns: Secondary | ICD-10-CM | POA: Diagnosis not present

## 2024-04-26 DIAGNOSIS — I10 Essential (primary) hypertension: Secondary | ICD-10-CM | POA: Diagnosis not present

## 2024-04-26 DIAGNOSIS — T3110 Burns involving 10-19% of body surface with 0% to 9% third degree burns: Secondary | ICD-10-CM | POA: Diagnosis not present

## 2024-04-27 DIAGNOSIS — R509 Fever, unspecified: Secondary | ICD-10-CM | POA: Diagnosis not present

## 2024-04-30 ENCOUNTER — Telehealth: Payer: Self-pay

## 2024-04-30 NOTE — Transitions of Care (Post Inpatient/ED Visit) (Signed)
   04/30/2024  Name: Justin Hudson MRN: [id number] DOB: 01/25/1956  Today's TOC FU Call Status: Today's TOC FU Call Status:: Unsuccessful Call (1st Attempt) Unsuccessful Call (1st Attempt) Date: 04/30/24  Attempted to reach the patient regarding the most recent Inpatient/ED visit.  Follow Up Plan: Additional outreach attempts will be made to reach the patient to complete the Transitions of Care (Post Inpatient/ED visit) call.   Ayaan Ringle J. Ryana Montecalvo RN, MSN [facility], [facility] RN Care Manager Direct Dial: [phone]  Fax: [phone] Website: [email or web address]

## 2024-05-01 ENCOUNTER — Telehealth: Payer: Self-pay

## 2024-05-01 NOTE — Transitions of Care (Post Inpatient/ED Visit) (Signed)
   05/01/2024  Name: Justin Hudson MRN: [id number] DOB: 1956-08-24  Today's TOC FU Call Status: Today's TOC FU Call Status:: Unsuccessful Call (2nd Attempt) Unsuccessful Call (2nd Attempt) Date: 05/01/24  Attempted to reach the patient regarding the most recent Inpatient/ED visit.  Follow Up Plan: Additional outreach attempts will be made to reach the patient to complete the Transitions of Care (Post Inpatient/ED visit) call.   Damira Kem J. Conita Amenta RN, MSN [facility], [facility] RN Care Manager Direct Dial: [phone]  Fax: [phone] Website: [email or web address]

## 2024-05-02 ENCOUNTER — Telehealth: Payer: Self-pay

## 2024-05-02 NOTE — Transitions of Care (Post Inpatient/ED Visit) (Signed)
   05/02/2024  Name: Justin Hudson MRN: [id number] DOB: 1956/07/01  Today's TOC FU Call Status: Today's TOC FU Call Status:: Unsuccessful Call (3rd Attempt) Unsuccessful Call (3rd Attempt) Date: 05/02/24  Attempted to reach the patient regarding the most recent Inpatient/ED visit.  Follow Up Plan: No further outreach attempts will be made at this time. We have been unable to contact the patient.  Sydny Schnitzler J. Anaika Santillano RN, MSN [facility], [facility] RN Care Manager Direct Dial: [phone]  Fax: [phone] Website: [email or web address]

## 2024-05-07 DIAGNOSIS — T3110 Burns involving 10-19% of body surface with 0% to 9% third degree burns: Secondary | ICD-10-CM | POA: Diagnosis not present

## 2024-05-07 DIAGNOSIS — G8911 Acute pain due to trauma: Secondary | ICD-10-CM | POA: Diagnosis not present

## 2024-05-08 DIAGNOSIS — M79642 Pain in left hand: Secondary | ICD-10-CM | POA: Diagnosis not present

## 2024-05-08 DIAGNOSIS — M25642 Stiffness of left hand, not elsewhere classified: Secondary | ICD-10-CM | POA: Diagnosis not present

## 2024-05-08 DIAGNOSIS — M25632 Stiffness of left wrist, not elsewhere classified: Secondary | ICD-10-CM | POA: Diagnosis not present

## 2024-05-08 DIAGNOSIS — M25512 Pain in left shoulder: Secondary | ICD-10-CM | POA: Diagnosis not present

## 2024-05-17 DIAGNOSIS — M25632 Stiffness of left wrist, not elsewhere classified: Secondary | ICD-10-CM | POA: Diagnosis not present

## 2024-05-17 DIAGNOSIS — M25642 Stiffness of left hand, not elsewhere classified: Secondary | ICD-10-CM | POA: Diagnosis not present

## 2024-05-17 DIAGNOSIS — M79642 Pain in left hand: Secondary | ICD-10-CM | POA: Diagnosis not present

## 2024-05-17 DIAGNOSIS — M25512 Pain in left shoulder: Secondary | ICD-10-CM | POA: Diagnosis not present

## 2024-05-24 DIAGNOSIS — M25632 Stiffness of left wrist, not elsewhere classified: Secondary | ICD-10-CM | POA: Diagnosis not present

## 2024-05-24 DIAGNOSIS — M25512 Pain in left shoulder: Secondary | ICD-10-CM | POA: Diagnosis not present

## 2024-05-24 DIAGNOSIS — M79642 Pain in left hand: Secondary | ICD-10-CM | POA: Diagnosis not present

## 2024-05-24 DIAGNOSIS — M25642 Stiffness of left hand, not elsewhere classified: Secondary | ICD-10-CM | POA: Diagnosis not present

## 2024-05-29 DIAGNOSIS — M25512 Pain in left shoulder: Secondary | ICD-10-CM | POA: Diagnosis not present

## 2024-05-29 DIAGNOSIS — M79642 Pain in left hand: Secondary | ICD-10-CM | POA: Diagnosis not present

## 2024-05-29 DIAGNOSIS — M25632 Stiffness of left wrist, not elsewhere classified: Secondary | ICD-10-CM | POA: Diagnosis not present

## 2024-05-29 DIAGNOSIS — M25642 Stiffness of left hand, not elsewhere classified: Secondary | ICD-10-CM | POA: Diagnosis not present

## 2024-06-04 DIAGNOSIS — M25642 Stiffness of left hand, not elsewhere classified: Secondary | ICD-10-CM | POA: Diagnosis not present

## 2024-06-04 DIAGNOSIS — M25632 Stiffness of left wrist, not elsewhere classified: Secondary | ICD-10-CM | POA: Diagnosis not present

## 2024-06-04 DIAGNOSIS — M79642 Pain in left hand: Secondary | ICD-10-CM | POA: Diagnosis not present

## 2024-06-04 DIAGNOSIS — M25512 Pain in left shoulder: Secondary | ICD-10-CM | POA: Diagnosis not present

## 2024-06-11 DIAGNOSIS — M25632 Stiffness of left wrist, not elsewhere classified: Secondary | ICD-10-CM | POA: Diagnosis not present

## 2024-06-11 DIAGNOSIS — M25642 Stiffness of left hand, not elsewhere classified: Secondary | ICD-10-CM | POA: Diagnosis not present

## 2024-06-11 DIAGNOSIS — M25512 Pain in left shoulder: Secondary | ICD-10-CM | POA: Diagnosis not present

## 2024-06-11 DIAGNOSIS — M79642 Pain in left hand: Secondary | ICD-10-CM | POA: Diagnosis not present

## 2024-06-18 DIAGNOSIS — M25512 Pain in left shoulder: Secondary | ICD-10-CM | POA: Diagnosis not present

## 2024-06-18 DIAGNOSIS — M79642 Pain in left hand: Secondary | ICD-10-CM | POA: Diagnosis not present

## 2024-06-18 DIAGNOSIS — M25632 Stiffness of left wrist, not elsewhere classified: Secondary | ICD-10-CM | POA: Diagnosis not present

## 2024-06-18 DIAGNOSIS — M25642 Stiffness of left hand, not elsewhere classified: Secondary | ICD-10-CM | POA: Diagnosis not present

## 2024-07-02 DIAGNOSIS — M25632 Stiffness of left wrist, not elsewhere classified: Secondary | ICD-10-CM | POA: Diagnosis not present

## 2024-07-02 DIAGNOSIS — M79642 Pain in left hand: Secondary | ICD-10-CM | POA: Diagnosis not present

## 2024-07-02 DIAGNOSIS — M25642 Stiffness of left hand, not elsewhere classified: Secondary | ICD-10-CM | POA: Diagnosis not present

## 2024-07-02 DIAGNOSIS — M25512 Pain in left shoulder: Secondary | ICD-10-CM | POA: Diagnosis not present

## 2024-07-09 DIAGNOSIS — T3110 Burns involving 10-19% of body surface with 0% to 9% third degree burns: Secondary | ICD-10-CM | POA: Diagnosis not present

## 2024-07-12 DIAGNOSIS — M79642 Pain in left hand: Secondary | ICD-10-CM | POA: Diagnosis not present

## 2024-07-12 DIAGNOSIS — M25642 Stiffness of left hand, not elsewhere classified: Secondary | ICD-10-CM | POA: Diagnosis not present

## 2024-07-12 DIAGNOSIS — M25632 Stiffness of left wrist, not elsewhere classified: Secondary | ICD-10-CM | POA: Diagnosis not present

## 2024-07-12 DIAGNOSIS — M25512 Pain in left shoulder: Secondary | ICD-10-CM | POA: Diagnosis not present

## 2024-07-16 DIAGNOSIS — M25642 Stiffness of left hand, not elsewhere classified: Secondary | ICD-10-CM | POA: Diagnosis not present

## 2024-07-16 DIAGNOSIS — M79642 Pain in left hand: Secondary | ICD-10-CM | POA: Diagnosis not present

## 2024-07-16 DIAGNOSIS — M25632 Stiffness of left wrist, not elsewhere classified: Secondary | ICD-10-CM | POA: Diagnosis not present

## 2024-07-16 DIAGNOSIS — M25512 Pain in left shoulder: Secondary | ICD-10-CM | POA: Diagnosis not present

## 2024-07-30 DIAGNOSIS — M79642 Pain in left hand: Secondary | ICD-10-CM | POA: Diagnosis not present

## 2024-07-30 DIAGNOSIS — M25642 Stiffness of left hand, not elsewhere classified: Secondary | ICD-10-CM | POA: Diagnosis not present

## 2024-07-30 DIAGNOSIS — M25512 Pain in left shoulder: Secondary | ICD-10-CM | POA: Diagnosis not present

## 2024-07-30 DIAGNOSIS — M25632 Stiffness of left wrist, not elsewhere classified: Secondary | ICD-10-CM | POA: Diagnosis not present

## 2024-08-06 DIAGNOSIS — M25642 Stiffness of left hand, not elsewhere classified: Secondary | ICD-10-CM | POA: Diagnosis not present

## 2024-08-06 DIAGNOSIS — M79642 Pain in left hand: Secondary | ICD-10-CM | POA: Diagnosis not present

## 2024-08-06 DIAGNOSIS — M25632 Stiffness of left wrist, not elsewhere classified: Secondary | ICD-10-CM | POA: Diagnosis not present

## 2024-08-06 DIAGNOSIS — M25512 Pain in left shoulder: Secondary | ICD-10-CM | POA: Diagnosis not present

## 2024-08-15 DIAGNOSIS — L910 Hypertrophic scar: Secondary | ICD-10-CM | POA: Diagnosis not present

## 2024-11-13 ENCOUNTER — Ambulatory Visit: Attending: Cardiology | Admitting: Cardiology

## 2024-11-13 VITALS — BP 120/64 | HR 78 | Ht 72.0 in | Wt 202.0 lb

## 2024-11-13 DIAGNOSIS — R002 Palpitations: Secondary | ICD-10-CM

## 2024-11-13 DIAGNOSIS — I2510 Atherosclerotic heart disease of native coronary artery without angina pectoris: Secondary | ICD-10-CM

## 2024-11-13 DIAGNOSIS — I2583 Coronary atherosclerosis due to lipid rich plaque: Secondary | ICD-10-CM | POA: Diagnosis not present

## 2024-11-13 MED ORDER — METOPROLOL TARTRATE 50 MG PO TABS: 50.0000 mg | ORAL_TABLET | ORAL | 0 refills | Status: AC

## 2024-11-13 MED ORDER — ASPIRIN 81 MG PO TBEC: 81.0000 mg | DELAYED_RELEASE_TABLET | Freq: Every day | ORAL | Status: AC
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
## 2024-11-13 NOTE — Patient Instructions (Signed)
 Medication Instructions:  Please start Aspirin  81 mg a day. Continue all other medications as listed.  *If you need a refill on your cardiac medications before your next appointment, please call your pharmacy*  Testing/Procedures:   Your cardiac CT will be scheduled at:  [facility] [address]  [place], [place] [address]  Please enter the parking lot using the [address] entrance and use the FREE valet service at the patient drop-off area. Enter the building and check-in with registration on the main floor.   Please follow these instructions carefully (unless otherwise directed):  An IV will be required for this test and Nitroglycerin will be given.  Hold all erectile dysfunction medications at least 3 days (72 hrs) prior to test. (Ie viagra, cialis, sildenafil, tadalafil, etc)   On the Night Before the Test: Be sure to Drink plenty of water. Do not consume any caffeinated/decaffeinated beverages or chocolate 12 hours prior to your test. Do not take any antihistamines 12 hours prior to your test.  On the Day of the Test: Drink plenty of water until 1 hour prior to the test. Do not eat any food 1 hour prior to test. You may take your regular medications prior to the test.  Take metoprolol  (Lopressor ) two hours prior to test. If you take Furosemide/Hydrochlorothiazide/Spironolactone/Chlorthalidone, please HOLD on the morning of the test. Patients who wear a continuous glucose monitor MUST remove the device prior to scanning.  After the Test: Drink plenty of water. After receiving IV contrast, you may experience a mild flushed feeling. This is normal. On occasion, you may experience a mild rash up to 24 hours after the test. This is not dangerous. If this occurs, you can take Benadryl 25 mg, Zyrtec, Claritin, or Allegra and increase your fluid intake. (Patients taking Tikosyn should avoid Benadryl, and may take Zyrtec, Claritin, or Allegra) If  you experience trouble breathing, this can be serious. If it is severe call 911 IMMEDIATELY. If it is mild, please call our office.  We will call to schedule your test 2-4 weeks out understanding that some insurance companies will need an authorization prior to the service being performed.   For more information and frequently asked questions, please visit our website : [email or web address]  For non-scheduling related questions, please contact the cardiac imaging nurse navigator should you have any questions/concerns: Cardiac Imaging Nurse Navigators Direct Office Dial: [phone]   For scheduling needs, including cancellations and rescheduling, please call Brittany, [phone].   Follow-Up: At [facility], you and your health needs are our priority.  As part of our continuing mission to provide you with exceptional heart care, our providers are all part of one team.  This team includes your primary Cardiologist (physician) and Advanced Practice Providers or APPs (Physician Assistants and Nurse Practitioners) who all work together to provide you with the care you need, when you need it.  Your next appointment:   Follow up will be based on the results of the above testing.  We recommend signing up for the patient portal called MyChart.  Sign up information is provided on this After Visit Summary.  MyChart is used to connect with patients for Virtual Visits (Telemedicine).  Patients are able to view lab/test results, encounter notes, upcoming appointments, etc.  Non-urgent messages can be sent to your provider as well.   To learn more about what you can do with MyChart, go to [email or web address].

## 2024-11-23 ENCOUNTER — Encounter (HOSPITAL_COMMUNITY): Payer: Self-pay

## 2024-11-27 ENCOUNTER — Ambulatory Visit (HOSPITAL_COMMUNITY)
Admission: RE | Admit: 2024-11-27 | Discharge: 2024-11-27 | Disposition: A | Source: Ambulatory Visit | Attending: Cardiology | Admitting: Cardiology

## 2024-11-27 DIAGNOSIS — R002 Palpitations: Secondary | ICD-10-CM

## 2024-11-27 DIAGNOSIS — I2510 Atherosclerotic heart disease of native coronary artery without angina pectoris: Secondary | ICD-10-CM

## 2024-11-27 MED ORDER — IOHEXOL 350 MG/ML SOLN
100.0000 mL | Freq: Once | INTRAVENOUS | Status: AC | PRN
  Administered 2024-11-27: 100 mL via INTRAVENOUS

## 2024-11-27 MED ORDER — NITROGLYCERIN 0.4 MG SL SUBL
0.8000 mg | SUBLINGUAL_TABLET | Freq: Once | SUBLINGUAL | Status: AC
  Administered 2024-11-27: 0.8 mg via SUBLINGUAL

## 2024-11-28 ENCOUNTER — Other Ambulatory Visit: Payer: Self-pay | Admitting: Internal Medicine

## 2024-11-28 ENCOUNTER — Ambulatory Visit (HOSPITAL_COMMUNITY)
Admission: RE | Admit: 2024-11-28 | Discharge: 2024-11-28 | Disposition: A | Source: Ambulatory Visit | Attending: Internal Medicine

## 2024-11-28 DIAGNOSIS — R931 Abnormal findings on diagnostic imaging of heart and coronary circulation: Secondary | ICD-10-CM

## 2024-11-29 ENCOUNTER — Ambulatory Visit: Payer: Self-pay | Admitting: Cardiology
